# Patient Record
Sex: Female | Born: 2000 | Hispanic: No | Marital: Single | Smoking: Never smoker
Health system: Southern US, Community
[De-identification: ages and names within clinical notes are randomized; demographics above are authoritative.]

---
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
## 2022-08-16 ENCOUNTER — Emergency Department (HOSPITAL_BASED_OUTPATIENT_CLINIC_OR_DEPARTMENT_OTHER): Payer: Medicaid Other

## 2022-08-16 ENCOUNTER — Emergency Department (HOSPITAL_BASED_OUTPATIENT_CLINIC_OR_DEPARTMENT_OTHER)
Admission: EM | Admit: 2022-08-16 | Discharge: 2022-08-16 | Disposition: A | Discharge status: Home / Self Care | Payer: Medicaid Other | Attending: Emergency Medicine | Admitting: Emergency Medicine

## 2022-08-16 ENCOUNTER — Encounter (HOSPITAL_BASED_OUTPATIENT_CLINIC_OR_DEPARTMENT_OTHER): Payer: Self-pay | Admitting: Emergency Medicine

## 2022-08-16 DIAGNOSIS — R1030 Lower abdominal pain, unspecified: Secondary | ICD-10-CM | POA: Insufficient documentation

## 2022-08-16 DIAGNOSIS — R748 Abnormal levels of other serum enzymes: Secondary | ICD-10-CM | POA: Diagnosis not present

## 2022-08-16 DIAGNOSIS — R197 Diarrhea, unspecified: Secondary | ICD-10-CM | POA: Diagnosis not present

## 2022-08-16 DIAGNOSIS — R112 Nausea with vomiting, unspecified: Secondary | ICD-10-CM | POA: Diagnosis present

## 2022-08-16 DIAGNOSIS — I10 Essential (primary) hypertension: Secondary | ICD-10-CM | POA: Diagnosis not present

## 2022-08-16 LAB — COMPREHENSIVE METABOLIC PANEL
ALT: 19 U/L (ref 0–44)
AST: 15 U/L (ref 15–41)
Albumin: 4.8 g/dL (ref 3.5–5.0)
Alkaline Phosphatase: 182 U/L — ABNORMAL HIGH (ref 38–126)
Anion gap: 9 (ref 5–15)
BUN: 17 mg/dL (ref 6–20)
CO2: 26 mmol/L (ref 22–32)
Calcium: 9.8 mg/dL (ref 8.9–10.3)
Chloride: 104 mmol/L (ref 98–111)
Creatinine, Ser: 0.71 mg/dL (ref 0.44–1.00)
GFR, Estimated: >60 mL/min (ref >60)
Glucose, Bld: 89 mg/dL (ref 70–99)
Potassium: 3.9 mmol/L (ref 3.5–5.1)
Sodium: 139 mmol/L (ref 135–145)
Total Bilirubin: 0.3 mg/dL (ref 0.3–1.2)
Total Protein: 8 g/dL (ref 6.5–8.1)

## 2022-08-16 LAB — CBC
HCT: 41.4 % (ref 36.0–46.0)
Hemoglobin: 13.8 g/dL (ref 12.0–15.0)
MCH: 28.8 pg (ref 26.0–34.0)
MCHC: 33.3 g/dL (ref 30.0–36.0)
MCV: 86.4 fL (ref 80.0–100.0)
Platelets: 296 10*3/uL (ref 150–400)
RBC: 4.79 MIL/uL (ref 3.87–5.11)
RDW: 13.6 % (ref 11.5–15.5)
WBC: 8.3 10*3/uL (ref 4.0–10.5)
nRBC: 0 % (ref 0.0–0.2)

## 2022-08-16 LAB — URINALYSIS, ROUTINE W REFLEX MICROSCOPIC
Bacteria, UA: NONE SEEN
Bilirubin Urine: NEGATIVE
Glucose, UA: NEGATIVE mg/dL
Ketones, ur: NEGATIVE mg/dL
Leukocytes,Ua: NEGATIVE
Nitrite: NEGATIVE
Protein, ur: NEGATIVE mg/dL
Specific Gravity, Urine: 1.023 (ref 1.005–1.030)
pH: 6 (ref 5.0–8.0)

## 2022-08-16 LAB — PREGNANCY, URINE: Preg Test, Ur: NEGATIVE

## 2022-08-16 LAB — LIPASE, BLOOD: Lipase: 28 U/L (ref 11–51)

## 2022-08-16 MED ORDER — ONDANSETRON HCL 4 MG PO TABS: 4.0000 mg | ORAL_TABLET | Freq: Four times a day (QID) | ORAL | 0 refills | Status: AC

## 2022-08-16 MED ORDER — IOHEXOL 300 MG/ML  SOLN
100.0000 mL | Freq: Once | INTRAMUSCULAR | Status: AC | PRN
  Administered 2022-08-16: 100 mL via INTRAVENOUS

## 2022-08-16 MED ORDER — LOPERAMIDE HCL 2 MG PO CAPS: 2.0000 mg | ORAL_CAPSULE | Freq: Four times a day (QID) | ORAL | 0 refills | Status: AC | PRN

## 2022-08-16 NOTE — ED Provider Notes (Signed)
[facility] [place] EMERGENCY DEPARTMENT AT [facility] Provider Note   CSN: [id number] Arrival date & time: 08/16/22  1803     History  Chief Complaint  Patient presents with   Abdominal Pain    Stephanie Guzman is a 22 y.o. female with no significant past medical history who presents the emergency department complaining of abdominal pain, nausea, vomiting, and diarrhea.  Patient states that the vomiting and diarrhea has been going on for about 3 weeks or so.  This occurs every time she eats.  She finds that it is worse when eating very greasy foods.  Abdominal pain started today, localized to the lower abdomen.  No urinary symptoms.  No vaginal bleeding or discharge.  No bright red or dark black stools.  Does not believe she has had a fever, but does have chills when she is about to vomit. No hx of abdominal surgeries. Gave birth via vaginal delivery in January.   Abdominal Pain Associated symptoms: diarrhea, nausea and vomiting        Home Medications Prior to Admission medications   Medication Sig Start Date End Date Taking? Authorizing Provider  loperamide (IMODIUM) 2 MG capsule Take 1 capsule (2 mg total) by mouth 4 (four) times daily as needed for diarrhea or loose stools. 08/16/22  Yes Laniece Hornbaker T, PA-C  ondansetron (ZOFRAN) 4 MG tablet Take 1 tablet (4 mg total) by mouth every 6 (six) hours. 08/16/22  Yes Dilan Novosad T, PA-C  polyethylene glycol powder (GLYCOLAX/MIRALAX) powder Take 17 g by mouth daily. 05/25/15   McDonell, Alfredia Client, MD      Allergies    Amoxicillin    Review of Systems   Review of Systems  Gastrointestinal:  Positive for abdominal pain, diarrhea, nausea and vomiting.  All other systems reviewed and are negative.   Physical Exam Updated Vital Signs BP 114/64   Pulse 98   Temp 98 F (36.7 C) (Oral)   Resp 20   Ht 5' (1.524 m)   Wt 59.9 kg   LMP  (LMP Unknown)   SpO2 96%   BMI 25.78 kg/m  Physical Exam Vitals and nursing note  reviewed.  Constitutional:      Appearance: Normal appearance.  HENT:     Head: Normocephalic and atraumatic.  Eyes:     Conjunctiva/sclera: Conjunctivae normal.  Cardiovascular:     Rate and Rhythm: Normal rate and regular rhythm.  Pulmonary:     Effort: Pulmonary effort is normal. No respiratory distress.     Breath sounds: Normal breath sounds.  Abdominal:     General: There is no distension.     Palpations: Abdomen is soft.     Tenderness: There is abdominal tenderness in the suprapubic area. Negative signs include Murphy's sign.  Skin:    General: Skin is warm and dry.  Neurological:     General: No focal deficit present.     Mental Status: She is alert.     ED Results / Procedures / Treatments   Labs (all labs ordered are listed, but only abnormal results are displayed) Labs Reviewed  COMPREHENSIVE METABOLIC PANEL - Abnormal; Notable for the following components:      Result Value   Alkaline Phosphatase 182 (*)    All other components within normal limits  URINALYSIS, ROUTINE W REFLEX MICROSCOPIC - Abnormal; Notable for the following components:   Hgb urine dipstick LARGE (*)    All other components within normal limits  LIPASE, BLOOD  CBC  PREGNANCY, URINE    EKG None  Radiology CT ABDOMEN PELVIS W CONTRAST  Result Date: 08/16/2022 CLINICAL DATA:  Lower abdominal pain for several weeks, initial encounter EXAM: CT ABDOMEN AND PELVIS WITH CONTRAST TECHNIQUE: Multidetector CT imaging of the abdomen and pelvis was performed using the standard protocol following bolus administration of intravenous contrast. RADIATION DOSE REDUCTION: This exam was performed according to the departmental dose-optimization program which includes automated exposure control, adjustment of the mA and/or kV according to patient size and/or use of iterative reconstruction technique. CONTRAST:  OMNIPAQUE IOHEXOL 300 MG/ML  SOLN COMPARISON:  None Available. FINDINGS: Lower chest: Lung  bases are free of acute infiltrate or sizable effusion. Slight asymmetry of the breasts is noted with the left breast being slightly prominent. This may be related to the imaging plane. Hepatobiliary: No focal liver abnormality is seen. No gallstones, gallbladder wall thickening, or biliary dilatation. Pancreas: Unremarkable. No pancreatic ductal dilatation or surrounding inflammatory changes. Spleen: Normal in size without focal abnormality. Adrenals/Urinary Tract: Adrenal glands are within normal limits. Kidneys demonstrate a normal enhancement pattern bilaterally. Small 2 mm nonobstructing right renal stone is seen. Bladder is decompressed. Stomach/Bowel: The appendix is within normal limits. No obstructive or inflammatory changes of the colon are seen. Small bowel and stomach are within normal limits. Vascular/Lymphatic: No significant vascular findings are present. No enlarged abdominal or pelvic lymph nodes. Reproductive: Uterus and bilateral adnexa are unremarkable. Other: No abdominal wall hernia or abnormality. No abdominopelvic ascites. Musculoskeletal: No acute or significant osseous findings. IMPRESSION: 2 mm nonobstructing right renal stone. No focal abnormality correspond with the given clinical history is noted. Slight asymmetry of the breasts is noted with the left breast being more prominent. This may be related to the imaging plane. Correlate with the physical exam. Electronically Signed   By: Alcide Clever M.D.   On: 08/16/2022 20:40   US Abdomen Limited RUQ (LIVER/GB)  Result Date: 08/16/2022 CLINICAL DATA:  Postprandial pain and nausea and vomiting EXAM: ULTRASOUND ABDOMEN LIMITED RIGHT UPPER QUADRANT COMPARISON:  None Available. FINDINGS: Gallbladder: Gallbladder is decompressed. No wall thickening or pericholecystic fluid is noted. No gallstones are seen. Common bile duct: Diameter: 2 mm. Liver: No focal lesion identified. Within normal limits in parenchymal echogenicity. Portal vein is  patent on color Doppler imaging with normal direction of blood flow towards the liver. Other: None. IMPRESSION: No acute abnormality noted in the right upper quadrant. Electronically Signed   By: Alcide Clever M.D.   On: 08/16/2022 20:07    Procedures Procedures    Medications Ordered in ED Medications  iohexol (OMNIPAQUE) 300 MG/ML solution 100 mL (100 mLs Intravenous Contrast Given 08/16/22 2026)    ED Course/ Medical Decision Making/ A&P                             Medical Decision Making Amount and/or Complexity of Data Reviewed Labs: ordered.   This patient is a 22 y.o. female  who presents to the ED for concern of abdominal pain x 3 weeks; nausea, vomiting, and diarrhea x 1 week.  Differential diagnoses prior to evaluation: The emergent differential diagnosis includes, but is not limited to,  AAA, mesenteric ischemia, appendicitis, diverticulitis, DKA, gastroenteritis, nephrolithiasis, pancreatitis, constipation, UTI, bowel obstruction, biliary disease, IBD, PUD, hepatitis, ectopic pregnancy, ovarian torsion, PID. This is not an exhaustive differential.   Past Medical History / Co-morbidities / Social History: History reviewed. No pertinent past medical history.  Additional history: Chart reviewed. Pertinent results include: Pt is G2P2, vaginal delivery on 1/25  Physical Exam: Physical exam performed. The pertinent findings include: Mildly hypertensive, otherwise normal vital signs.  No acute distress.  Abdomen soft, with some suprapubic tenderness to palpation.  Lab Tests/Imaging studies: I personally interpreted labs/imaging and the pertinent results include: No leukocytosis, normal hemoglobin.  CMP with elevated alkaline phosphatase, otherwise unremarkable.  Normal lipase.  Urinalysis with large hemoglobin, otherwise unremarkable.  Negative pregnancy.  Right upper quadrant ultrasound with no abnormalities.  CT abdomen pelvis with 2 mm nonobstructing right renal stone, no  other focal abnormalities.  I agree with the radiologist interpretation.  Disposition: After consideration of the diagnostic results and the patients response to treatment, I feel that emergency department workup does not suggest an emergent condition requiring admission or immediate intervention beyond what has been performed at this time. The plan is: Discharge to home with symptomatic management of subacute vomiting and diarrhea, postprandial.  No emergent etiology found for symptoms.  Recommended patient follow-up with GI for further testing and evaluation.  Will prescribe Zofran and Imodium to help with symptoms. The patient is safe for discharge and has been instructed to return immediately for worsening symptoms, change in symptoms or any other concerns.  Final Clinical Impression(s) / ED Diagnoses Final diagnoses:  Lower abdominal pain  Nausea vomiting and diarrhea    Rx / DC Orders ED Discharge Orders          Ordered    ondansetron (ZOFRAN) 4 MG tablet  Every 6 hours        08/16/22 2101    loperamide (IMODIUM) 2 MG capsule  4 times daily PRN        08/16/22 2101           Portions of this report may have been transcribed using voice recognition software. Every effort was made to ensure accuracy; however, inadvertent computerized transcription errors may be present.    Jeanella Flattery 08/16/22 2103    Maia Plan, MD 08/20/22 [phone]

## 2022-08-16 NOTE — ED Triage Notes (Signed)
Pt arrives pov, steady gait, c/o lower abd pain x 3 weeks, n/v/d x 1 week after eating

## 2022-08-16 NOTE — Discharge Instructions (Addendum)
You were seen in the emergency room today for vomiting, diarrhea, and abdominal pain.  As we discussed your lab work and imaging looked reassuring today.  We did not see any evidence of blockages, infections, or abnormalities with your kidneys, liver, or pancreas.  It is possible that your symptoms could be related to an underlying gastrointestinal disorder.  I am prescribing you both nausea and diarrhea medications that you can take to help control your symptoms.  I have attached an eating plan that could help you as well.  I would like you to follow-up with the gastroenterologist, for further testing and evaluation.  Continue to monitor how you're doing and return to the ER for new or worsening symptoms.
